# Patient Record
Sex: Female | Born: 1970 | Hispanic: Yes | Marital: Single | Smoking: Never smoker
Health system: Southern US, Community
[De-identification: ages and names within clinical notes are randomized; demographics above are authoritative.]

## PROBLEM LIST (undated history)

## (undated) DIAGNOSIS — H11009 Unspecified pterygium of unspecified eye: Secondary | ICD-10-CM

## (undated) DIAGNOSIS — M722 Plantar fascial fibromatosis: Secondary | ICD-10-CM

## (undated) DIAGNOSIS — E119 Type 2 diabetes mellitus without complications: Secondary | ICD-10-CM

## (undated) DIAGNOSIS — L209 Atopic dermatitis, unspecified: Secondary | ICD-10-CM

## (undated) DIAGNOSIS — E785 Hyperlipidemia, unspecified: Secondary | ICD-10-CM

## (undated) DIAGNOSIS — E039 Hypothyroidism, unspecified: Secondary | ICD-10-CM

---

## 2004-03-16 ENCOUNTER — Emergency Department (HOSPITAL_COMMUNITY): Admission: EM | Admit: 2004-03-16 | Discharge: 2004-03-16 | Payer: Self-pay | Admitting: Emergency Medicine

## 2005-10-05 ENCOUNTER — Emergency Department (HOSPITAL_COMMUNITY): Admission: EM | Admit: 2005-10-05 | Discharge: 2005-10-05 | Payer: Self-pay | Admitting: Emergency Medicine

## 2005-10-12 ENCOUNTER — Emergency Department (HOSPITAL_COMMUNITY): Admission: EM | Admit: 2005-10-12 | Discharge: 2005-10-12 | Payer: Self-pay | Admitting: Emergency Medicine

## 2005-10-20 ENCOUNTER — Emergency Department (HOSPITAL_COMMUNITY): Admission: EM | Admit: 2005-10-20 | Discharge: 2005-10-20 | Payer: Self-pay | Admitting: *Deleted

## 2005-11-01 ENCOUNTER — Emergency Department (HOSPITAL_COMMUNITY): Admission: EM | Admit: 2005-11-01 | Discharge: 2005-11-01 | Payer: Self-pay | Admitting: Emergency Medicine

## 2005-11-03 ENCOUNTER — Other Ambulatory Visit: Admission: RE | Admit: 2005-11-03 | Discharge: 2005-11-03 | Payer: Self-pay | Admitting: Obstetrics and Gynecology

## 2005-11-05 ENCOUNTER — Emergency Department (HOSPITAL_COMMUNITY): Admission: EM | Admit: 2005-11-05 | Discharge: 2005-11-05 | Payer: Self-pay | Admitting: *Deleted

## 2005-11-15 ENCOUNTER — Emergency Department (HOSPITAL_COMMUNITY): Admission: EM | Admit: 2005-11-15 | Discharge: 2005-11-15 | Payer: Self-pay | Admitting: Emergency Medicine

## 2006-12-18 ENCOUNTER — Ambulatory Visit: Payer: Self-pay | Admitting: Family Medicine

## 2006-12-19 ENCOUNTER — Ambulatory Visit: Payer: Self-pay | Admitting: Family Medicine

## 2006-12-20 ENCOUNTER — Ambulatory Visit: Payer: Self-pay | Admitting: *Deleted

## 2007-01-22 ENCOUNTER — Ambulatory Visit: Payer: Self-pay | Admitting: Family Medicine

## 2007-03-20 ENCOUNTER — Other Ambulatory Visit: Admission: RE | Admit: 2007-03-20 | Discharge: 2007-03-20 | Payer: Self-pay | Admitting: Nurse Practitioner

## 2007-03-20 ENCOUNTER — Encounter (INDEPENDENT_AMBULATORY_CARE_PROVIDER_SITE_OTHER): Payer: Self-pay | Admitting: Nurse Practitioner

## 2007-03-20 ENCOUNTER — Ambulatory Visit: Payer: Self-pay | Admitting: Internal Medicine

## 2007-04-25 ENCOUNTER — Ambulatory Visit: Payer: Self-pay | Admitting: Internal Medicine

## 2010-01-27 ENCOUNTER — Encounter: Admission: RE | Admit: 2010-01-27 | Discharge: 2010-01-27 | Payer: Self-pay | Admitting: Specialist

## 2011-08-23 ENCOUNTER — Other Ambulatory Visit: Payer: Self-pay | Admitting: Specialist

## 2011-08-23 DIAGNOSIS — R110 Nausea: Secondary | ICD-10-CM

## 2011-08-25 ENCOUNTER — Other Ambulatory Visit: Payer: Self-pay

## 2016-03-23 ENCOUNTER — Encounter (HOSPITAL_COMMUNITY): Payer: Self-pay | Admitting: *Deleted

## 2016-03-23 ENCOUNTER — Emergency Department (HOSPITAL_COMMUNITY)
Admission: EM | Admit: 2016-03-23 | Discharge: 2016-03-23 | Disposition: A | Discharge status: Home / Self Care | Payer: Self-pay | Attending: Emergency Medicine | Admitting: Emergency Medicine

## 2016-03-23 ENCOUNTER — Emergency Department (HOSPITAL_COMMUNITY): Payer: Self-pay

## 2016-03-23 DIAGNOSIS — K2970 Gastritis, unspecified, without bleeding: Secondary | ICD-10-CM | POA: Insufficient documentation

## 2016-03-23 DIAGNOSIS — E785 Hyperlipidemia, unspecified: Secondary | ICD-10-CM | POA: Insufficient documentation

## 2016-03-23 DIAGNOSIS — R1013 Epigastric pain: Secondary | ICD-10-CM

## 2016-03-23 DIAGNOSIS — Z79899 Other long term (current) drug therapy: Secondary | ICD-10-CM | POA: Insufficient documentation

## 2016-03-23 DIAGNOSIS — E039 Hypothyroidism, unspecified: Secondary | ICD-10-CM | POA: Insufficient documentation

## 2016-03-23 HISTORY — DX: Unspecified pterygium of unspecified eye: H11.009

## 2016-03-23 HISTORY — DX: Atopic dermatitis, unspecified: L20.9

## 2016-03-23 HISTORY — DX: Plantar fascial fibromatosis: M72.2

## 2016-03-23 HISTORY — DX: Hypothyroidism, unspecified: E03.9

## 2016-03-23 HISTORY — DX: Hyperlipidemia, unspecified: E78.5

## 2016-03-23 LAB — CBC WITH DIFFERENTIAL/PLATELET
BASOS ABS: 0 10*3/uL (ref 0.0–0.1)
BASOS PCT: 0 %
Eosinophils Absolute: 0 10*3/uL (ref 0.0–0.7)
Eosinophils Relative: 0 %
HEMATOCRIT: 35.4 % — AB (ref 36.0–46.0)
Hemoglobin: 11.8 g/dL — ABNORMAL LOW (ref 12.0–15.0)
Lymphocytes Relative: 17 %
Lymphs Abs: 0.9 10*3/uL (ref 0.7–4.0)
MCH: 28.5 pg (ref 26.0–34.0)
MCHC: 33.3 g/dL (ref 30.0–36.0)
MCV: 85.5 fL (ref 78.0–100.0)
MONO ABS: 0.6 10*3/uL (ref 0.1–1.0)
Monocytes Relative: 12 %
NEUTROS ABS: 3.8 10*3/uL (ref 1.7–7.7)
Neutrophils Relative %: 71 %
PLATELETS: 266 10*3/uL (ref 150–400)
RBC: 4.14 MIL/uL (ref 3.87–5.11)
RDW: 14.8 % (ref 11.5–15.5)
WBC: 5.3 10*3/uL (ref 4.0–10.5)

## 2016-03-23 LAB — URINE MICROSCOPIC-ADD ON

## 2016-03-23 LAB — COMPREHENSIVE METABOLIC PANEL
ALBUMIN: 3.6 g/dL (ref 3.5–5.0)
ALT: 46 U/L (ref 14–54)
AST: 50 U/L — AB (ref 15–41)
Alkaline Phosphatase: 98 U/L (ref 38–126)
Anion gap: 8 (ref 5–15)
BUN: 12 mg/dL (ref 6–20)
CHLORIDE: 102 mmol/L (ref 101–111)
CO2: 27 mmol/L (ref 22–32)
CREATININE: 0.62 mg/dL (ref 0.44–1.00)
Calcium: 8.6 mg/dL — ABNORMAL LOW (ref 8.9–10.3)
GFR calc non Af Amer: >60 mL/min (ref >60)
Glucose, Bld: 111 mg/dL — ABNORMAL HIGH (ref 65–99)
POTASSIUM: 3.5 mmol/L (ref 3.5–5.1)
Sodium: 137 mmol/L (ref 135–145)
Total Bilirubin: 0.5 mg/dL (ref 0.3–1.2)
Total Protein: 6.7 g/dL (ref 6.5–8.1)

## 2016-03-23 LAB — URINALYSIS, ROUTINE W REFLEX MICROSCOPIC
Bilirubin Urine: NEGATIVE
Glucose, UA: NEGATIVE mg/dL
Ketones, ur: NEGATIVE mg/dL
LEUKOCYTES UA: NEGATIVE
Nitrite: NEGATIVE
Protein, ur: NEGATIVE mg/dL
SPECIFIC GRAVITY, URINE: 1.007 (ref 1.005–1.030)
pH: 7.5 (ref 5.0–8.0)

## 2016-03-23 LAB — LIPASE, BLOOD: Lipase: 29 U/L (ref 11–51)

## 2016-03-23 MED ORDER — SUCRALFATE 1 G PO TABS: 1.0000 g | ORAL_TABLET | Freq: Three times a day (TID) | ORAL | Status: DC

## 2016-03-23 MED ORDER — HYDROMORPHONE HCL 1 MG/ML IJ SOLN
1.0000 mg | Freq: Once | INTRAMUSCULAR | Status: AC
  Administered 2016-03-23: 1 mg via INTRAVENOUS
  Filled 2016-03-23: qty 1

## 2016-03-23 MED ORDER — RANITIDINE HCL 150 MG PO CAPS: 150.0000 mg | ORAL_CAPSULE | Freq: Every day | ORAL | Status: DC

## 2016-03-23 MED ORDER — SODIUM CHLORIDE 0.9 % IV BOLUS (SEPSIS)
1000.0000 mL | Freq: Once | INTRAVENOUS | Status: AC
  Administered 2016-03-23: 1000 mL via INTRAVENOUS

## 2016-03-23 MED ORDER — ONDANSETRON HCL 4 MG/2ML IJ SOLN
4.0000 mg | Freq: Once | INTRAMUSCULAR | Status: AC
  Administered 2016-03-23: 4 mg via INTRAVENOUS
  Filled 2016-03-23: qty 2

## 2016-03-23 MED ORDER — GI COCKTAIL ~~LOC~~
30.0000 mL | Freq: Once | ORAL | Status: AC
  Administered 2016-03-23: 30 mL via ORAL
  Filled 2016-03-23: qty 30

## 2016-03-23 NOTE — ED Notes (Addendum)
RUQ abdominal sign since yesterday.  [facility] and [facility] for r/o cholecystisis vs pancreatitis.  Pt had 4mg  zofran from ems and a 20g IV was placed.  Spanish speaking

## 2016-03-23 NOTE — Discharge Instructions (Signed)
Gastritis - Adultos °(Gastritis, Adult) ° La gastrittis es la irritación (inflamación) de la membrana interna del estómago. Puede ser una enfermedad de inicio súbito (aguda) o de largo plazo (crónica). Si la gastritis no se trata, puede causar sangrado y úlceras. °CAUSAS  °La gastritis se produce cuando la membrana que tapiza interiormente al estómago se debilita o se daña. Los jugos digestivos del estómago inflaman el revestimiento del estómago debilitado. El revestimiento del estómago puede debilitarse o dañarse por una infección viral o bacteriana. La infección bacteriana más común es la infección por Helicobacter pylori. También puede ser el resultado del consumo excesivo de alcohol, por el uso de ciertos medicamentos o porque hay demasiado ácido en el estómago.  °SÍNTOMAS  °En algunos casos no hay síntomas. Si se presentan síntomas, éstos pueden ser:  °· Dolor o sensación de ardor en la parte superior del abdomen. °· Náuseas. °· Vómitos. °· Sensación molesta de distensión después de comer. °DIAGNÓSTICO  °El médico puede diagnosticar gastritis según los síntomas y el examen físico. Para determinar la causa de la gastritis, el médico podrá:  °· Pedir análisis de sangre o de materia fecal para diagnosticar la presencia de la bacteria H pylori. °· Gastroscopía. Un tubo delgado y flexible (endoscopio) se pasa por el esófago hasta llegar al estómago. El endoscopio tiene una luz y una cámara en el extremo. El médico utilizará el endoscopio para observar el interior del estómago. °· Tomará una muestra de tejido (biopsia) del estómago para examinarlo en el microscopio. °TRATAMIENTO  °Según la causa de la gastritis podrán recetarle: Antibióticos, si la causa es una infección bacteriana, como una infección por H. pylori. Antiácidos o bloqueadores H2, si hay demasiado ácido en el estómago. El médico le aconsejará que deje de tomar aspirina, ibuprofeno u otros antiinflamatorios no esteroides (AINE).  °INSTRUCCIONES PARA EL  CUIDADO EN EL HOGAR  °· Tome sólo medicamentos de venta libre o recetados, según las indicaciones del médico. °· Si le han recetado antibióticos, tómelos según las indicaciones. Tómelos todos, aunque se sienta mejor. °· Debe ingerir gran cantidad de líquido para mantener la orina de tono claro o color amarillo pálido. °· Evite las comidas y bebidas que empeoran los problemas, como: °¨ Bebidas con cafeína o alcohólicas. °¨ Chocolate. °¨ Sabores a menta. °¨ Ajo y cebolla. °¨ Comidas muy condimentadas. °¨ Cítricos como naranjas, limones o limas. °¨ Alimentos que contengan tomate, como salsas, chile y pizza. °¨ Alimentos fritos y grasos. °· Haga comidas pequeñas durante el día en lugar de 3 comidas abundantes. °SOLICITE ATENCIÓN MÉDICA DE INMEDIATO SI:  °· La materia fecal es negra o de color rojo oscuro. °· Vomita sangre de color rojo brillante o material similar a granos de café. °· No puede retener los líquidos. °· El dolor abdominal empeora. °· Tiene fiebre. °· No mejora luego de 1 semana. °· Tiene preguntas o preocupaciones. °ASEGÚRESE DE QUE:  °· Comprende estas instrucciones. °· Controlará su enfermedad. °· Solicitará ayuda de inmediato si no mejora o si empeora. °  °Esta información no tiene como fin reemplazar el consejo del médico. Asegúrese de hacerle al médico cualquier pregunta que tenga. °  °Document Released: 06/01/2005 Document Revised: 05/13/2015 °Elsevier Interactive Patient Education ©2016 [employer]. ° °

## 2016-03-23 NOTE — ED Provider Notes (Signed)
CSN: [id number]     Arrival date & time 03/23/16  1006 History   First MD Initiated Contact with Patient 03/23/16 1010     Chief Complaint  Patient presents with  . Abdominal Pain     (Consider location/radiation/quality/duration/timing/severity/associated sxs/prior Treatment) HPI Comments: 45 year old female with history of hypothyroidism presents for epigastric pain. The patient states that the pain started yesterday. She reports that the night before that she had felt some chills. She says that last night she developed severe pain in her upper abdomen and that it does radiate to her back. She reports that she has had some similar pain but it was much less severe after eating spicy food or chilies. She reports severe nausea without any vomiting. No diarrhea or constipation. She previously [place] but has not had any other surgical procedures. She was seen at an urgent care and sent in for evaluation for possible cholecystitis. Denies any history of drinking alcohol or smoking cigarettes.  Patient is a 45 y.o. female presenting with abdominal pain.  Abdominal Pain Associated symptoms: chills and nausea   Associated symptoms: no chest pain, no constipation, no cough, no diarrhea, no dysuria, no fatigue, no fever, no hematuria, no shortness of breath and no vomiting     Past Medical History  Diagnosis Date  . Atopic dermatitis   . Pterygium   . Plantar fasciitis   . Dyslipidemia   . Hypothyroid    History reviewed. No pertinent past surgical history. No family history on file. Social History  Substance Use Topics  . Smoking status: Never Smoker   . Smokeless tobacco: None  . Alcohol Use: No   OB History    No data available     Review of Systems  Constitutional: Positive for chills. Negative for fever and fatigue.  HENT: Negative for congestion, postnasal drip and rhinorrhea.   Eyes: Negative for visual disturbance.  Respiratory: Negative for cough, chest  tightness and shortness of breath.   Cardiovascular: Negative for chest pain and palpitations.  Gastrointestinal: Positive for nausea and abdominal pain. Negative for vomiting, diarrhea and constipation.  Genitourinary: Negative for dysuria, urgency and hematuria.  Musculoskeletal: Positive for back pain. Negative for myalgias.  Skin: Negative for rash.  Neurological: Negative for dizziness, weakness and headaches.  Hematological: Does not bruise/bleed easily.      Allergies  Review of patient's allergies indicates no known allergies.  Home Medications   Prior to Admission medications   Medication Sig Start Date End Date Taking? Authorizing Provider  levothyroxine (SYNTHROID, LEVOTHROID) 50 MCG tablet Take 50 mcg by mouth daily before breakfast.   Yes Historical Provider, MD  loratadine (CLARITIN) 10 MG tablet Take 10 mg by mouth daily.   Yes Historical Provider, MD  Omega-3 Fatty Acids (FISH OIL) 1000 MG CPDR Take by mouth.   Yes Historical Provider, MD  triamcinolone cream (KENALOG) 0.5 % Apply 1 application topically 2 (two) times daily.   Yes Historical Provider, MD   BP 118/65 mmHg  Pulse 65  Temp(Src) 97.9 F (36.6 C) (Oral)  Resp 15  SpO2 96%  LMP 03/20/2016 Physical Exam  Constitutional: She is oriented to person, place, and time. She appears well-developed and well-nourished.  Appears extremely uncomfortable, is tearful  HENT:  Head: Normocephalic and atraumatic.  Right Ear: External ear normal.  Left Ear: External ear normal.  Nose: Nose normal.  Mouth/Throat: Oropharynx is clear and moist. No oropharyngeal exudate.  Eyes: EOM are normal. Pupils are  equal, round, and reactive to light.  Neck: Normal range of motion. Neck supple.  Cardiovascular: Normal rate, regular rhythm, normal heart sounds and intact distal pulses.   No murmur heard. Pulmonary/Chest: Effort normal. No respiratory distress. She has no wheezes. She has no rales.  Abdominal: Soft. She exhibits  no distension. There is tenderness (moderate to severe) in the right upper quadrant and epigastric area.  Musculoskeletal: Normal range of motion. She exhibits no edema or tenderness.  Neurological: She is alert and oriented to person, place, and time.  Skin: Skin is warm and dry. No rash noted. She is not diaphoretic.  Vitals reviewed.   ED Course  Procedures (including critical care time) Labs Review Labs Reviewed  CBC WITH DIFFERENTIAL/PLATELET - Abnormal; Notable for the following:    Hemoglobin 11.8 (*)    HCT 35.4 (*)    All other components within normal limits  COMPREHENSIVE METABOLIC PANEL - Abnormal; Notable for the following:    Glucose, Bld 111 (*)    Calcium 8.6 (*)    AST 50 (*)    All other components within normal limits  LIPASE, BLOOD  URINALYSIS, ROUTINE W REFLEX MICROSCOPIC (NOT AT [facility])    Imaging Review US Abdomen Limited Ruq  03/23/2016  CLINICAL DATA:  Right upper quadrant pain for 2 days EXAM: US ABDOMEN LIMITED - RIGHT UPPER QUADRANT COMPARISON:  None. FINDINGS: Gallbladder: No gallstones or wall thickening visualized. No sonographic Murphy sign noted by sonographer. Common bile duct: Diameter: 3.3 mm in diameter within normal limits Liver: No focal hepatic mass. There is mild increased echogenicity of the liver suspicious for fatty infiltration. IMPRESSION: 1. No gallstones are noted within gallbladder. Normal CBD. No focal hepatic mass. Mild increased echogenicity of the liver suspicious for fatty infiltration. Electronically Signed   By: Natasha Mead M.D.   On: 03/23/2016 11:15   I have personally reviewed and evaluated these images and lab results as part of my medical decision-making.   EKG Interpretation None      MDM  Patient was seen and evaluated at bedside.  Patient appears uncomfortable.  Concern for cholecystitis vs gastritis.  Pain and nausea medication ordered.  Will check CBC, CMP, lipase, UA, RUQ [place].  Labs unremarkable and [place] without acute  process.  GI cocktail ordered.  12:44 PM Patient reports feeling improved. Less pain.  Patient tolerated PO without difficulty.  She was discharged home in stable condition with instruction to follow up outpatient and with prescription for carafate and eduction on gastritis and peptic ulcer disease.  All resutls and plan of care were reviewed via interpreter over the phone.  Patient expressed understanding and agreement and was given the opportunity to ask any questions.  She was discharged home in stable condition. Final diagnoses:  Epigastric pain    1. Gastritis     Leta Baptist, MD 03/24/16 [phone]

## 2016-03-23 NOTE — ED Notes (Signed)
Bed: WA05 Expected date:  Expected time:  Means of arrival:  Comments: EMS-abdominal pain 

## 2016-03-23 NOTE — ED Notes (Signed)
Pt is having US at this time

## 2016-09-26 ENCOUNTER — Emergency Department (HOSPITAL_COMMUNITY)
Admission: EM | Admit: 2016-09-26 | Discharge: 2016-09-27 | Disposition: A | Discharge status: Home / Self Care | Payer: Self-pay | Attending: Emergency Medicine | Admitting: Emergency Medicine

## 2016-09-26 ENCOUNTER — Encounter (HOSPITAL_COMMUNITY): Payer: Self-pay

## 2016-09-26 DIAGNOSIS — R059 Cough, unspecified: Secondary | ICD-10-CM

## 2016-09-26 DIAGNOSIS — J181 Lobar pneumonia, unspecified organism: Secondary | ICD-10-CM | POA: Insufficient documentation

## 2016-09-26 DIAGNOSIS — R05 Cough: Secondary | ICD-10-CM

## 2016-09-26 DIAGNOSIS — Z79899 Other long term (current) drug therapy: Secondary | ICD-10-CM | POA: Insufficient documentation

## 2016-09-26 DIAGNOSIS — E119 Type 2 diabetes mellitus without complications: Secondary | ICD-10-CM | POA: Insufficient documentation

## 2016-09-26 DIAGNOSIS — J189 Pneumonia, unspecified organism: Secondary | ICD-10-CM

## 2016-09-26 DIAGNOSIS — R791 Abnormal coagulation profile: Secondary | ICD-10-CM | POA: Insufficient documentation

## 2016-09-26 DIAGNOSIS — R0981 Nasal congestion: Secondary | ICD-10-CM

## 2016-09-26 DIAGNOSIS — E039 Hypothyroidism, unspecified: Secondary | ICD-10-CM | POA: Insufficient documentation

## 2016-09-26 DIAGNOSIS — R509 Fever, unspecified: Secondary | ICD-10-CM

## 2016-09-26 HISTORY — DX: Type 2 diabetes mellitus without complications: E11.9

## 2016-09-26 MED ORDER — ACETAMINOPHEN 325 MG PO TABS
ORAL_TABLET | ORAL | Status: DC
Start: 2016-09-26 — End: 2016-09-27
  Filled 2016-09-26: qty 1

## 2016-09-26 MED ORDER — ACETAMINOPHEN 325 MG PO TABS
650.0000 mg | ORAL_TABLET | Freq: Once | ORAL | Status: AC | PRN
  Administered 2016-09-26: 650 mg via ORAL

## 2016-09-26 MED ORDER — ACETAMINOPHEN 325 MG PO TABS
ORAL_TABLET | ORAL | Status: AC
  Administered 2016-09-26: 650 mg via ORAL
  Filled 2016-09-26: qty 2

## 2016-09-26 NOTE — ED Triage Notes (Signed)
Pt states she has had flu sx x 3 weeks with no relief; pt has been taking OTC med at home; pt presents tachycardic with labored breathing at triage; Pt states body aches at 8/10 on arrival. Pt a&ox4; Pt speaks spanish

## 2016-09-27 ENCOUNTER — Emergency Department (HOSPITAL_COMMUNITY): Payer: Self-pay

## 2016-09-27 LAB — CBC WITH DIFFERENTIAL/PLATELET
BASOS PCT: 0 %
Basophils Absolute: 0 10*3/uL (ref 0.0–0.1)
Eosinophils Absolute: 0.5 10*3/uL (ref 0.0–0.7)
Eosinophils Relative: 6 %
HEMATOCRIT: 37.8 % (ref 36.0–46.0)
Hemoglobin: 12.8 g/dL (ref 12.0–15.0)
LYMPHS ABS: 1.1 10*3/uL (ref 0.7–4.0)
Lymphocytes Relative: 14 %
MCH: 29.2 pg (ref 26.0–34.0)
MCHC: 33.9 g/dL (ref 30.0–36.0)
MCV: 86.3 fL (ref 78.0–100.0)
MONO ABS: 0.9 10*3/uL (ref 0.1–1.0)
MONOS PCT: 11 %
Neutro Abs: 5.6 10*3/uL (ref 1.7–7.7)
Neutrophils Relative %: 70 %
Platelets: 324 10*3/uL (ref 150–400)
RBC: 4.38 MIL/uL (ref 3.87–5.11)
RDW: 14.7 % (ref 11.5–15.5)
WBC: 8.1 10*3/uL (ref 4.0–10.5)

## 2016-09-27 LAB — COMPREHENSIVE METABOLIC PANEL
ALBUMIN: 3.6 g/dL (ref 3.5–5.0)
ALT: 27 U/L (ref 14–54)
ANION GAP: 9 (ref 5–15)
AST: 35 U/L (ref 15–41)
Alkaline Phosphatase: 109 U/L (ref 38–126)
BUN: 7 mg/dL (ref 6–20)
CHLORIDE: 102 mmol/L (ref 101–111)
CO2: 25 mmol/L (ref 22–32)
Calcium: 9.1 mg/dL (ref 8.9–10.3)
Creatinine, Ser: 0.72 mg/dL (ref 0.44–1.00)
GFR calc Af Amer: >60 mL/min (ref >60)
GFR calc non Af Amer: >60 mL/min (ref >60)
GLUCOSE: 102 mg/dL — AB (ref 65–99)
POTASSIUM: 3.8 mmol/L (ref 3.5–5.1)
SODIUM: 136 mmol/L (ref 135–145)
TOTAL PROTEIN: 7 g/dL (ref 6.5–8.1)

## 2016-09-27 LAB — URINALYSIS, ROUTINE W REFLEX MICROSCOPIC
Bilirubin Urine: NEGATIVE
GLUCOSE, UA: NEGATIVE mg/dL
HGB URINE DIPSTICK: NEGATIVE
Ketones, ur: NEGATIVE mg/dL
LEUKOCYTES UA: NEGATIVE
Nitrite: NEGATIVE
PH: 9 — AB (ref 5.0–8.0)
PROTEIN: NEGATIVE mg/dL
Specific Gravity, Urine: 1.023 (ref 1.005–1.030)

## 2016-09-27 LAB — PROTIME-INR
INR: 0.87
Prothrombin Time: 11.9 seconds (ref 11.4–15.2)

## 2016-09-27 LAB — I-STAT CG4 LACTIC ACID, ED: Lactic Acid, Venous: 1.35 mmol/L (ref 0.5–1.9)

## 2016-09-27 LAB — POC URINE PREG, ED: Preg Test, Ur: NEGATIVE

## 2016-09-27 MED ORDER — SODIUM CHLORIDE 0.9 % IV BOLUS (SEPSIS)
1000.0000 mL | Freq: Once | INTRAVENOUS | Status: AC
  Administered 2016-09-27: 1000 mL via INTRAVENOUS

## 2016-09-27 MED ORDER — ALBUTEROL SULFATE HFA 108 (90 BASE) MCG/ACT IN AERS
2.0000 | INHALATION_SPRAY | RESPIRATORY_TRACT | Status: DC | PRN
  Administered 2016-09-27: 2 via RESPIRATORY_TRACT
  Filled 2016-09-27: qty 6.7

## 2016-09-27 MED ORDER — SODIUM CHLORIDE 0.9 % IV BOLUS (SEPSIS)
500.0000 mL | Freq: Once | INTRAVENOUS | Status: AC
  Administered 2016-09-27: 500 mL via INTRAVENOUS

## 2016-09-27 MED ORDER — FLUTICASONE PROPIONATE 50 MCG/ACT NA SUSP: 2.0000 | Freq: Every day | NASAL | 2 refills | Status: AC

## 2016-09-27 MED ORDER — IBUPROFEN 400 MG PO TABS
400.0000 mg | ORAL_TABLET | Freq: Once | ORAL | Status: AC
  Administered 2016-09-27: 400 mg via ORAL
  Filled 2016-09-27: qty 1

## 2016-09-27 MED ORDER — LEVOFLOXACIN 750 MG PO TABS
750.0000 mg | ORAL_TABLET | Freq: Once | ORAL | Status: AC
  Administered 2016-09-27: 750 mg via ORAL
  Filled 2016-09-27: qty 1

## 2016-09-27 MED ORDER — AEROCHAMBER PLUS W/MASK MISC
1.0000 | Freq: Once | Status: AC
  Administered 2016-09-27: 1
  Filled 2016-09-27: qty 1

## 2016-09-27 MED ORDER — LEVOFLOXACIN 750 MG PO TABS: 750.0000 mg | ORAL_TABLET | Freq: Every day | ORAL | 0 refills | Status: AC

## 2016-09-27 MED ORDER — ALBUTEROL SULFATE (2.5 MG/3ML) 0.083% IN NEBU
5.0000 mg | INHALATION_SOLUTION | Freq: Once | RESPIRATORY_TRACT | Status: AC
  Administered 2016-09-27: 5 mg via RESPIRATORY_TRACT
  Filled 2016-09-27: qty 6

## 2016-09-27 MED ORDER — BENZONATATE 100 MG PO CAPS: 100.0000 mg | ORAL_CAPSULE | Freq: Three times a day (TID) | ORAL | 0 refills | Status: AC

## 2016-09-27 NOTE — Discharge Instructions (Signed)
1. Medications: flonase, OTC mucinex, tessalon, albuterol, Levaquin, usual home medications 2. Treatment: rest, drink plenty of fluids, take tylenol or ibuprofen for fever control 3. Follow Up: Please followup with your primary doctor in 2-3 days for discussion of your diagnoses and further evaluation after today's visit; if you do not have a primary care doctor use the resource guide provided to find one; Return to the ER for high fevers, difficulty breathing or other concerning symptoms

## 2016-09-27 NOTE — ED Provider Notes (Signed)
[facility]-EMERGENCY DEPT Provider Note   CSN: [id number] Arrival date & time: 09/26/16  2324     History   Chief Complaint Chief Complaint  Patient presents with  . Chills  . Generalized Body Aches  . Cough  . Fever    HPI Melissa Frye is a 46 y.o. female with a hx of Dermatitis, non-insulin-dependent diabetes, hypothyroidism presents to the Emergency Department complaining of waxing and waning influenza-like illness onset 3 weeks ago. Associated symptoms include cough, rhinorrhea, subjective fevers and myalgias rated at a 8/10.  Pt reports taking anti-pyretics at home without relief.  Nothing makes it better and nothing makes it worse.  Pt denies headache, neck pain, chest pain, SOB, rash, open wounds, abd pain, vomiting, diarrhea, dizziness syncope.  Denies recent travel or leg swelling.  Did not receive a flu vaccine this year.  Sick contacts at work with similar symptoms.     The history is provided by the patient and medical records. The history is limited by a language barrier. A language interpreter was used.    Past Medical History:  Diagnosis Date  . Atopic dermatitis   . Diabetes mellitus without complication (HCC)   . Dyslipidemia   . Hypothyroid   . Plantar fasciitis   . Pterygium     There are no active problems to display for this patient.   History reviewed. No pertinent surgical history.  OB History    No data available       Home Medications    Prior to Admission medications   Medication Sig Start Date End Date Taking? Authorizing Provider  levothyroxine (SYNTHROID, LEVOTHROID) 50 MCG tablet Take 50 mcg by mouth daily before breakfast.   Yes Historical Provider, MD  loratadine (CLARITIN) 10 MG tablet Take 10 mg by mouth daily as needed for allergies.    Yes Historical Provider, MD  benzonatate (TESSALON) 100 MG capsule Take 1 capsule (100 mg total) by mouth every 8 (eight) hours. 09/27/16   Shayne Deerman, PA-C  fluticasone (FLONASE) 50  MCG/ACT nasal spray Place 2 sprays into both nostrils daily. 09/27/16   Cederic Mozley, PA-C  levofloxacin (LEVAQUIN) 750 MG tablet Take 1 tablet (750 mg total) by mouth daily. X 7 days 09/27/16   Dierdre ForthHannah Tashira Torre, PA-C    Family History No family history on file.  Social History Social History  Substance Use Topics  . Smoking status: Never Smoker  . Smokeless tobacco: Not on file  . Alcohol use No     Allergies   Patient has no known allergies.   Review of Systems Review of Systems  Constitutional: Positive for fever.  HENT: Positive for congestion, sinus pressure and sore throat.   Eyes: Negative for visual disturbance.  Respiratory: Positive for cough and chest tightness.   Gastrointestinal: Positive for nausea.  Endocrine: Negative for polydipsia, polyphagia and polyuria.  Genitourinary: Positive for dysuria ( x3 mos).  Musculoskeletal: Positive for myalgias.  Skin: Negative for rash.  Neurological: Negative for headaches.  Hematological: Negative for adenopathy.     Physical Exam Updated Vital Signs BP 112/78 (BP Location: Right Arm)   Pulse 115   Temp 100.9 F (38.3 C) (Oral)   Resp (!) 28   Wt 89.8 kg   LMP 08/30/2016   SpO2 98%   Physical Exam  Constitutional: She appears well-developed and well-nourished. No distress.  Awake, alert, nontoxic appearance  HENT:  Head: Normocephalic and atraumatic.  Mouth/Throat: Oropharynx is clear and moist. No oropharyngeal exudate.  Eyes: Conjunctivae  are normal. No scleral icterus.  Neck: Normal range of motion. Neck supple.  Cardiovascular: Regular rhythm and intact distal pulses.  Tachycardia present.   Pulmonary/Chest: Breath sounds normal. Accessory muscle usage present. Tachypnea noted. She is in respiratory distress. She has no wheezes.  Equal chest expansion  Abdominal: Soft. Bowel sounds are normal. She exhibits no mass. There is no tenderness. There is no rebound and no guarding.  Musculoskeletal:  Normal range of motion. She exhibits no edema.  Neurological: She is alert.  Speech is clear and goal oriented Moves extremities without ataxia  Skin: Skin is warm and dry. She is not diaphoretic.  Psychiatric: She has a normal mood and affect.  Nursing note and vitals reviewed.    ED Treatments / Results  Labs (all labs ordered are listed, but only abnormal results are displayed) Labs Reviewed  COMPREHENSIVE METABOLIC PANEL - Abnormal; Notable for the following:       Result Value   Glucose, Bld 102 (*)    Total Bilirubin <0.1 (*)    All other components within normal limits  URINALYSIS, ROUTINE W REFLEX MICROSCOPIC - Abnormal; Notable for the following:    pH 9.0 (*)    All other components within normal limits  CULTURE, BLOOD (ROUTINE X 2)  CULTURE, BLOOD (ROUTINE X 2)  URINE CULTURE  CBC WITH DIFFERENTIAL/PLATELET  PROTIME-INR  I-STAT CG4 LACTIC ACID, ED  POC URINE PREG, ED     Radiology Dg Chest 2 View  Result Date: 09/27/2016 CLINICAL DATA:  Fever and chest pain with cough times 10 days EXAM: CHEST  2 VIEW COMPARISON:  None. FINDINGS: Calcified granuloma in the left upper lobe. Mild diffuse interstitial prominence consistent bronchitic change. No alveolar consolidation. Elevated right hemidiaphragm with mild extrapleural thickening along the periphery of the right hemithorax. Loculated small left effusion is not entirely excluded. Heart is top-normal in size. There is aortic atherosclerosis. No acute osseous abnormality IMPRESSION: 1. Mild diffuse interstitial prominence which may reflect bronchitic change, possibly chronic. No alveolar consolidation. 2. Left upper lobe calcified nodule versus bone island. 3. Elevation of the right hemidiaphragm with mild extrapleural thickening versus trace loculated fluid. Electronically Signed   By: Tollie Eth M.D.   On: 09/27/2016 00:26    Procedures Procedures (including critical care time)  Medications Ordered in ED Medications    levofloxacin (LEVAQUIN) tablet 750 mg (not administered)  albuterol (PROVENTIL HFA;VENTOLIN HFA) 108 (90 Base) MCG/ACT inhaler 2 puff (not administered)  aerochamber plus with mask device 1 each (not administered)  acetaminophen (TYLENOL) tablet 650 mg (650 mg Oral Given 09/26/16 2346)  albuterol (PROVENTIL) (2.5 MG/3ML) 0.083% nebulizer solution 5 mg (5 mg Nebulization Given 09/27/16 0205)  sodium chloride 0.9 % bolus 1,000 mL (0 mLs Intravenous Stopped 09/27/16 0256)  ibuprofen (ADVIL,MOTRIN) tablet 400 mg (400 mg Oral Given 09/27/16 0204)  sodium chloride 0.9 % bolus 500 mL (500 mLs Intravenous New Bag/Given 09/27/16 0416)     Initial Impression / Assessment and Plan / ED Course  I have reviewed the triage vital signs and the nursing notes.  Pertinent labs & imaging results that were available during my care of the patient were reviewed by me and considered in my medical decision making (see chart for details).     Patient presents with fever, tachypnea and 3 weeks of URI symptoms. Chest x-ray with questionable pneumonia. Will begin Levaquin. Normal lactic acid. Normal white blood cell count.  No anemia. No evidence of UTI.  Patient fever controlled with  improvement of vital signs.  No evidence of sepsis.  Patient reports she is feeling significantly better. Discussed reasons to return to the emergency department including worsening fevers, double deep breathing or other concerns. Patient referred to the [facility] to set up primary care. She states understanding and is in agreement with the plan.    Final Clinical Impressions(s) / ED Diagnoses   Final diagnoses:  Community acquired pneumonia of right lower lobe of lung (HCC)  Congestion of nasal sinus  Fever, unspecified fever cause  Cough    New Prescriptions New Prescriptions   BENZONATATE (TESSALON) 100 MG CAPSULE    Take 1 capsule (100 mg total) by mouth every 8 (eight) hours.   FLUTICASONE (FLONASE) 50 MCG/ACT NASAL SPRAY     Place 2 sprays into both nostrils daily.   LEVOFLOXACIN (LEVAQUIN) 750 MG TABLET    Take 1 tablet (750 mg total) by mouth daily. X 7 days     Dierdre Forth, PA-C 09/27/16 1610    Shon Baton, MD 09/28/16 [phone]

## 2016-09-28 LAB — URINE CULTURE

## 2016-10-02 ENCOUNTER — Encounter (HOSPITAL_COMMUNITY): Payer: Self-pay | Admitting: *Deleted

## 2016-10-02 ENCOUNTER — Emergency Department (HOSPITAL_COMMUNITY): Payer: Self-pay

## 2016-10-02 ENCOUNTER — Emergency Department (HOSPITAL_COMMUNITY)
Admission: EM | Admit: 2016-10-02 | Discharge: 2016-10-02 | Disposition: A | Discharge status: Home / Self Care | Payer: Self-pay | Attending: Emergency Medicine | Admitting: Emergency Medicine

## 2016-10-02 DIAGNOSIS — E039 Hypothyroidism, unspecified: Secondary | ICD-10-CM | POA: Insufficient documentation

## 2016-10-02 DIAGNOSIS — J111 Influenza due to unidentified influenza virus with other respiratory manifestations: Secondary | ICD-10-CM | POA: Insufficient documentation

## 2016-10-02 DIAGNOSIS — E119 Type 2 diabetes mellitus without complications: Secondary | ICD-10-CM | POA: Insufficient documentation

## 2016-10-02 DIAGNOSIS — Z79899 Other long term (current) drug therapy: Secondary | ICD-10-CM | POA: Insufficient documentation

## 2016-10-02 LAB — CULTURE, BLOOD (ROUTINE X 2)
CULTURE: NO GROWTH
CULTURE: NO GROWTH

## 2016-10-02 MED ORDER — ONDANSETRON 4 MG PO TBDP: 4.0000 mg | ORAL_TABLET | Freq: Three times a day (TID) | ORAL | 0 refills | Status: AC | PRN

## 2016-10-02 MED ORDER — IBUPROFEN 800 MG PO TABS
800.0000 mg | ORAL_TABLET | Freq: Once | ORAL | Status: AC
  Administered 2016-10-02: 800 mg via ORAL
  Filled 2016-10-02: qty 1

## 2016-10-02 MED ORDER — ACETAMINOPHEN 500 MG PO TABS
1000.0000 mg | ORAL_TABLET | Freq: Once | ORAL | Status: AC
Start: 2016-10-02 — End: 2016-10-02
  Administered 2016-10-02: 1000 mg via ORAL
  Filled 2016-10-02: qty 2

## 2016-10-02 NOTE — ED Notes (Signed)
Used spanish interpreter for discharge instructions

## 2016-10-02 NOTE — ED Provider Notes (Signed)
[facility]-EMERGENCY DEPT Provider Note   CSN: [id number] Arrival date & time: 10/02/16  1015     History   Chief Complaint Chief Complaint  Patient presents with  . Influenza    HPI Melissa Frye is a 46 y.o. female.  46 yo F with a chief complaint of cough congestion fevers chills myalgias and headache. Going on for the past week. Patient was seen here at the onset of symptoms diagnosed with the flu and possible pneumonia. She was started on antibiotics and Tessalon Perles. Patient has not taken any other medicines since this started. She feels that she's been having continued fevers and chills. She also has been having trouble eating and drinking. Having a worsening headache. Denies vomiting.   The history is provided by the patient and a relative. The history is limited by a language barrier. A language interpreter was used.  Illness  This is a new problem. The current episode started more than 2 days ago. The problem occurs constantly. The problem has not changed since onset.Associated symptoms include headaches. Pertinent negatives include no chest pain and no shortness of breath. Nothing aggravates the symptoms. Nothing relieves the symptoms. She has tried nothing for the symptoms. The treatment provided no relief.    Past Medical History:  Diagnosis Date  . Atopic dermatitis   . Diabetes mellitus without complication (HCC)   . Dyslipidemia   . Hypothyroid   . Plantar fasciitis   . Pterygium     There are no active problems to display for this patient.   History reviewed. No pertinent surgical history.  OB History    No data available       Home Medications    Prior to Admission medications   Medication Sig Start Date End Date Taking? Authorizing Provider  benzonatate (TESSALON) 100 MG capsule Take 1 capsule (100 mg total) by mouth every 8 (eight) hours. 09/27/16   Hannah Muthersbaugh, PA-C  fluticasone (FLONASE) 50 MCG/ACT nasal spray Place 2 sprays into  both nostrils daily. 09/27/16   Hannah Muthersbaugh, PA-C  levofloxacin (LEVAQUIN) 750 MG tablet Take 1 tablet (750 mg total) by mouth daily. X 7 days 09/27/16   Dahlia Client Muthersbaugh, PA-C  levothyroxine (SYNTHROID, LEVOTHROID) 50 MCG tablet Take 50 mcg by mouth daily before breakfast.    Historical Provider, MD  loratadine (CLARITIN) 10 MG tablet Take 10 mg by mouth daily as needed for allergies.     Historical Provider, MD  ondansetron (ZOFRAN ODT) 4 MG disintegrating tablet Take 1 tablet (4 mg total) by mouth every 8 (eight) hours as needed for nausea or vomiting. 10/02/16   Melene Plan, DO    Family History History reviewed. No pertinent family history.  Social History Social History  Substance Use Topics  . Smoking status: Never Smoker  . Smokeless tobacco: Not on file  . Alcohol use No     Allergies   Patient has no known allergies.   Review of Systems Review of Systems  Constitutional: Positive for chills and fever.  HENT: Positive for congestion. Negative for rhinorrhea.   Eyes: Negative for redness and visual disturbance.  Respiratory: Positive for cough. Negative for shortness of breath and wheezing.   Cardiovascular: Negative for chest pain and palpitations.  Gastrointestinal: Negative for nausea and vomiting.  Genitourinary: Negative for dysuria and urgency.  Musculoskeletal: Positive for myalgias. Negative for arthralgias.  Skin: Negative for pallor and wound.  Neurological: Positive for headaches. Negative for dizziness.     Physical Exam Updated Vital  Signs BP 117/89 (BP Location: Right Arm)   Pulse 78   Temp 98.4 F (36.9 C) (Oral)   Resp 15   SpO2 100%   Physical Exam  Constitutional: She is oriented to person, place, and time. She appears well-developed and well-nourished. No distress.  HENT:  Head: Normocephalic and atraumatic.  Eyes: EOM are normal. Pupils are equal, round, and reactive to light.  Neck: Normal range of motion. Neck supple.    Cardiovascular: Normal rate and regular rhythm.  Exam reveals no gallop and no friction rub.   No murmur heard. Pulmonary/Chest: Effort normal. She has no wheezes. She has no rales.  Transient rhonchi in the right lower field, tachypnea   Abdominal: Soft. She exhibits no distension and no mass. There is no tenderness. There is no guarding.  Musculoskeletal: She exhibits no edema or tenderness.  Neurological: She is alert and oriented to person, place, and time.  Skin: Skin is warm and dry. She is not diaphoretic.  Psychiatric: She has a normal mood and affect. Her behavior is normal.  Nursing note and vitals reviewed.    ED Treatments / Results  Labs (all labs ordered are listed, but only abnormal results are displayed) Labs Reviewed - No data to display  EKG  EKG Interpretation  Date/Time:  Sunday October 02 2016 11:14:05 EST Ventricular Rate:  80 PR Interval:    QRS Duration: 74 QT Interval:  364 QTC Calculation: 420 R Axis:   40 Text Interpretation:  Sinus rhythm No old tracing to compare Confirmed by Tatsuya Okray MD, DANIEL ([id number]) on 10/02/2016 11:21:26 AM       Radiology Dg Chest 2 View  Result Date: 10/02/2016 CLINICAL DATA:  Cough, fever, body aches EXAM: CHEST  2 VIEW COMPARISON:  09/27/2016 FINDINGS: Low lung volumes. Elevation of the right hemidiaphragm, stable. Bibasilar airspace opacities. Mild peribronchial thickening. Blunting of the right costophrenic angle again noted which could represent pleural thickening or small effusion, unchanged No acute bony abnormality. IMPRESSION: Low volumes. Bronchitic changes with bibasilar atelectasis or infiltrates. Small right pleural effusion versus pleural thickening, stable. Electronically Signed   By: Kevin  Dover M.D.   On: 10/02/2016 11:42    Procedures Procedures (including critical care time)  Medications Ordered in ED Medications  acetaminophen (TYLENOL) tablet 1,000 mg (1,000 mg Oral Given 10/02/16 1146)  ibuprofen  (ADVIL,MOTRIN) tablet 800 mg (800 mg Oral Given 10/02/16 1146)     Initial Impression / Assessment and Plan / ED Course  I have reviewed the triage vital signs and the nursing notes.  Pertinent labs & imaging results that were available during my care of the patient were reviewed by me and considered in my medical decision making (see chart for details).     45  yo F With a chief complaint of influenza. This is the expected course somewhat he was not taking Tylenol or ibuprofen in my opinion. With the recent diagnosis of pneumonia will obtain a chest x-ray to evaluate for possible worsening. We'll give Tylenol and ibuprofen.  11:51 AM:  I have discussed the diagnosis/risks/treatment options with the patient and family and believe the pt to be eligible for discharge home to follow-up with PCP. We also discussed returning to the ED immediately if new or worsening sx occur. We discussed the sx which are most concerning (e.g., sudden worsening pain, fever, inability to tolerate by mouth) that necessitate immediate return. Medications administered to the patient during their visit and any new prescriptions provided to the patient are listed  below.  Medications given during this visit Medications  acetaminophen (TYLENOL) tablet 1,000 mg (1,000 mg Oral Given 10/02/16 1146)  ibuprofen (ADVIL,MOTRIN) tablet 800 mg (800 mg Oral Given 10/02/16 1146)     The patient appears reasonably screen and/or stabilized for discharge and I doubt any other medical condition or other [facility] requiring further screening, evaluation, or treatment in the ED at this time prior to discharge.    Final Clinical Impressions(s) / ED Diagnoses   Final diagnoses:  Influenza    New Prescriptions New Prescriptions   ONDANSETRON (ZOFRAN ODT) 4 MG DISINTEGRATING TABLET    Take 1 tablet (4 mg total) by mouth every 8 (eight) hours as needed for nausea or vomiting.     Melene Planan Karsten Vaughn, DO 10/02/16 1151

## 2016-10-02 NOTE — ED Notes (Signed)
MD and RN  using interpreter line to communicate with pt

## 2016-10-02 NOTE — ED Triage Notes (Addendum)
Pt was seen here on 1/22 for flu like symptoms. reports feeling better and now has return of symptoms such as headache, bodyaches, hoarseness, fever and fatigue. Airway intact. Mask on pt at triage. Used interpretor phone, pt speaks spanish.

## 2016-10-02 NOTE — Discharge Instructions (Signed)
Take tylenol 2 pills 4 times a day and motrin 4 pills 3 times a day.  Drink plenty of fluids.  Return for worsening shortness of breath, headache, confusion. Follow up with your family doctor.   

## 2018-04-07 IMAGING — DX DG CHEST 2V
2 series · 2 of 2 positions shown · non-contrast
Comparison: None.

CLINICAL DATA: Fever and chest pain with cough times 10 days

EXAM:
CHEST  2 VIEW

[chest pa]
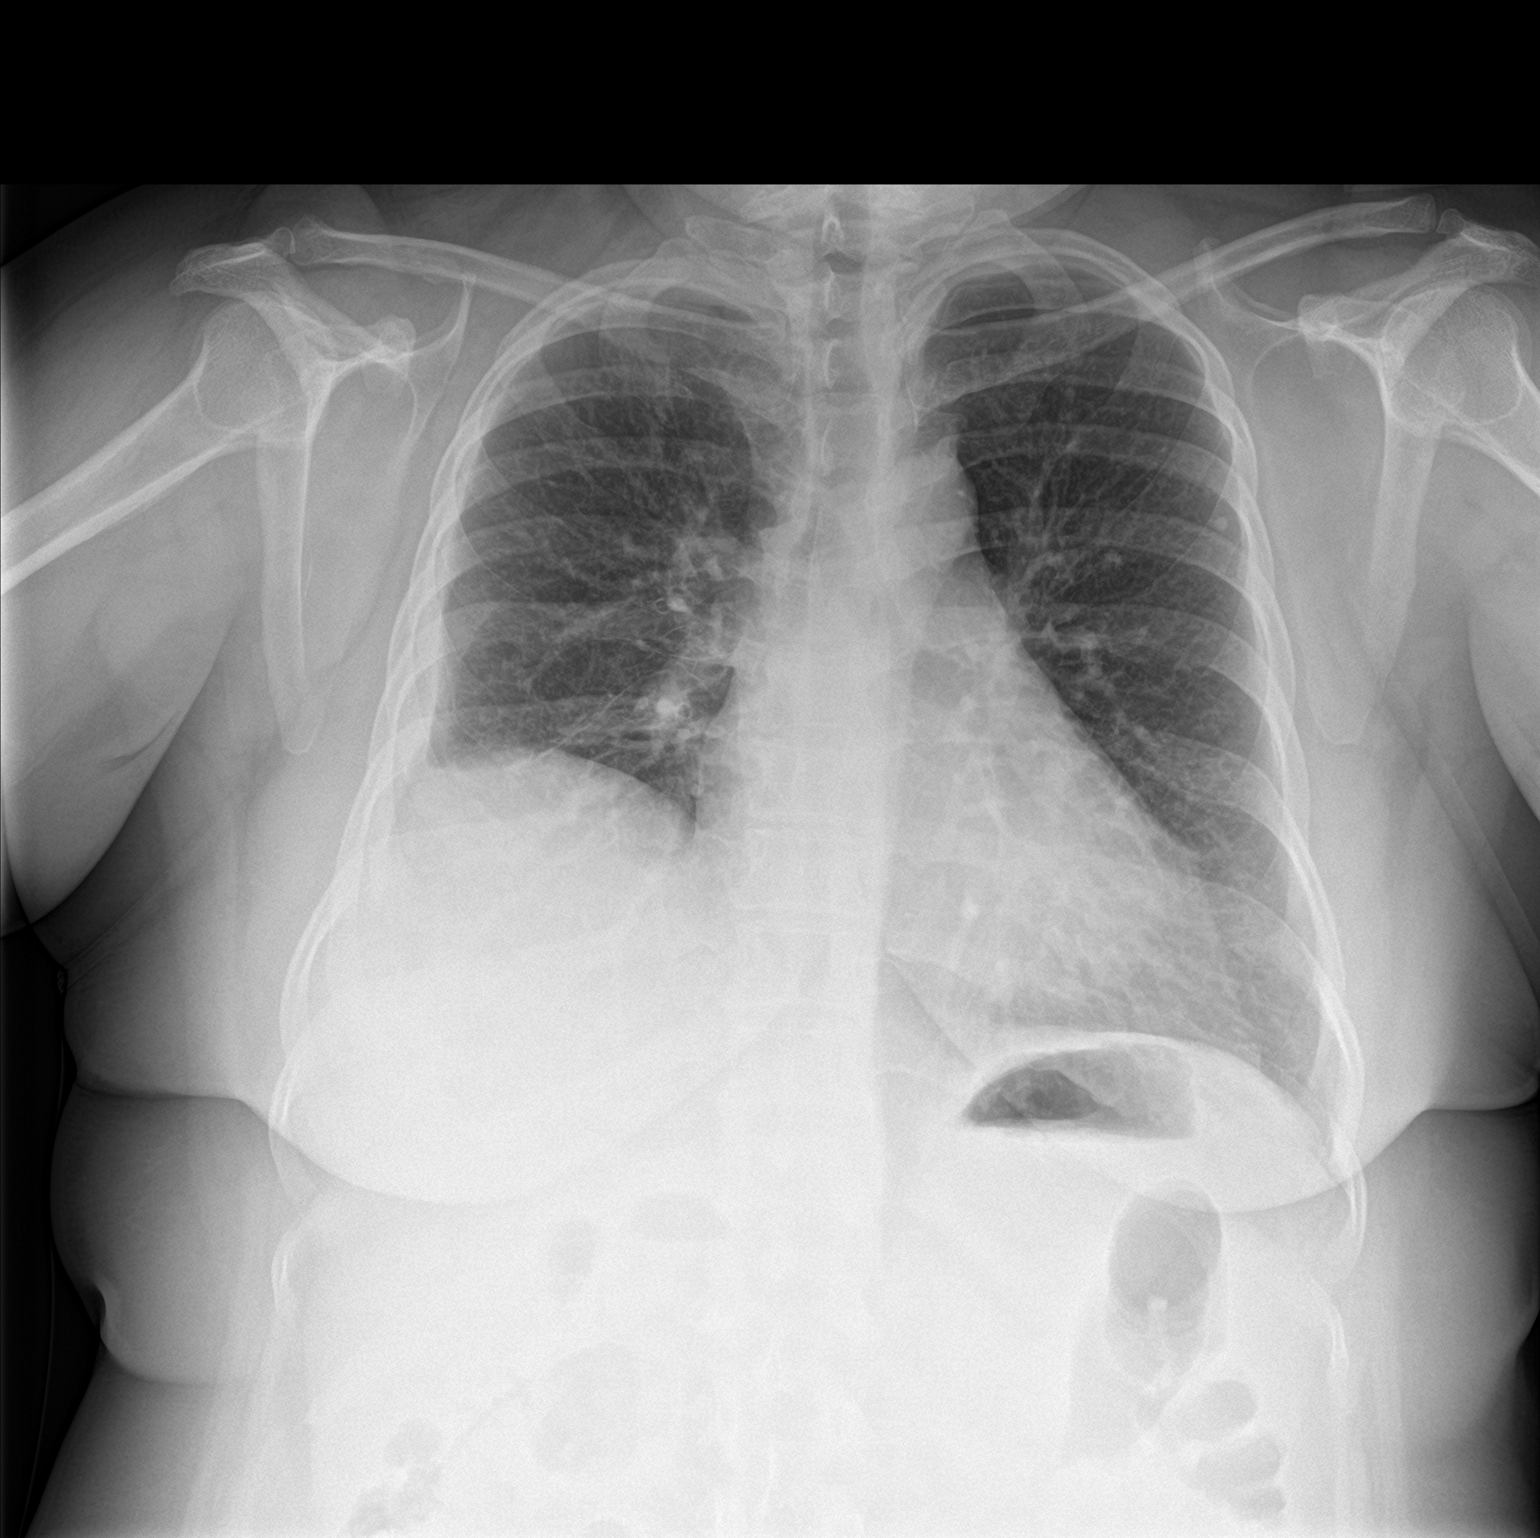

[chest lat]
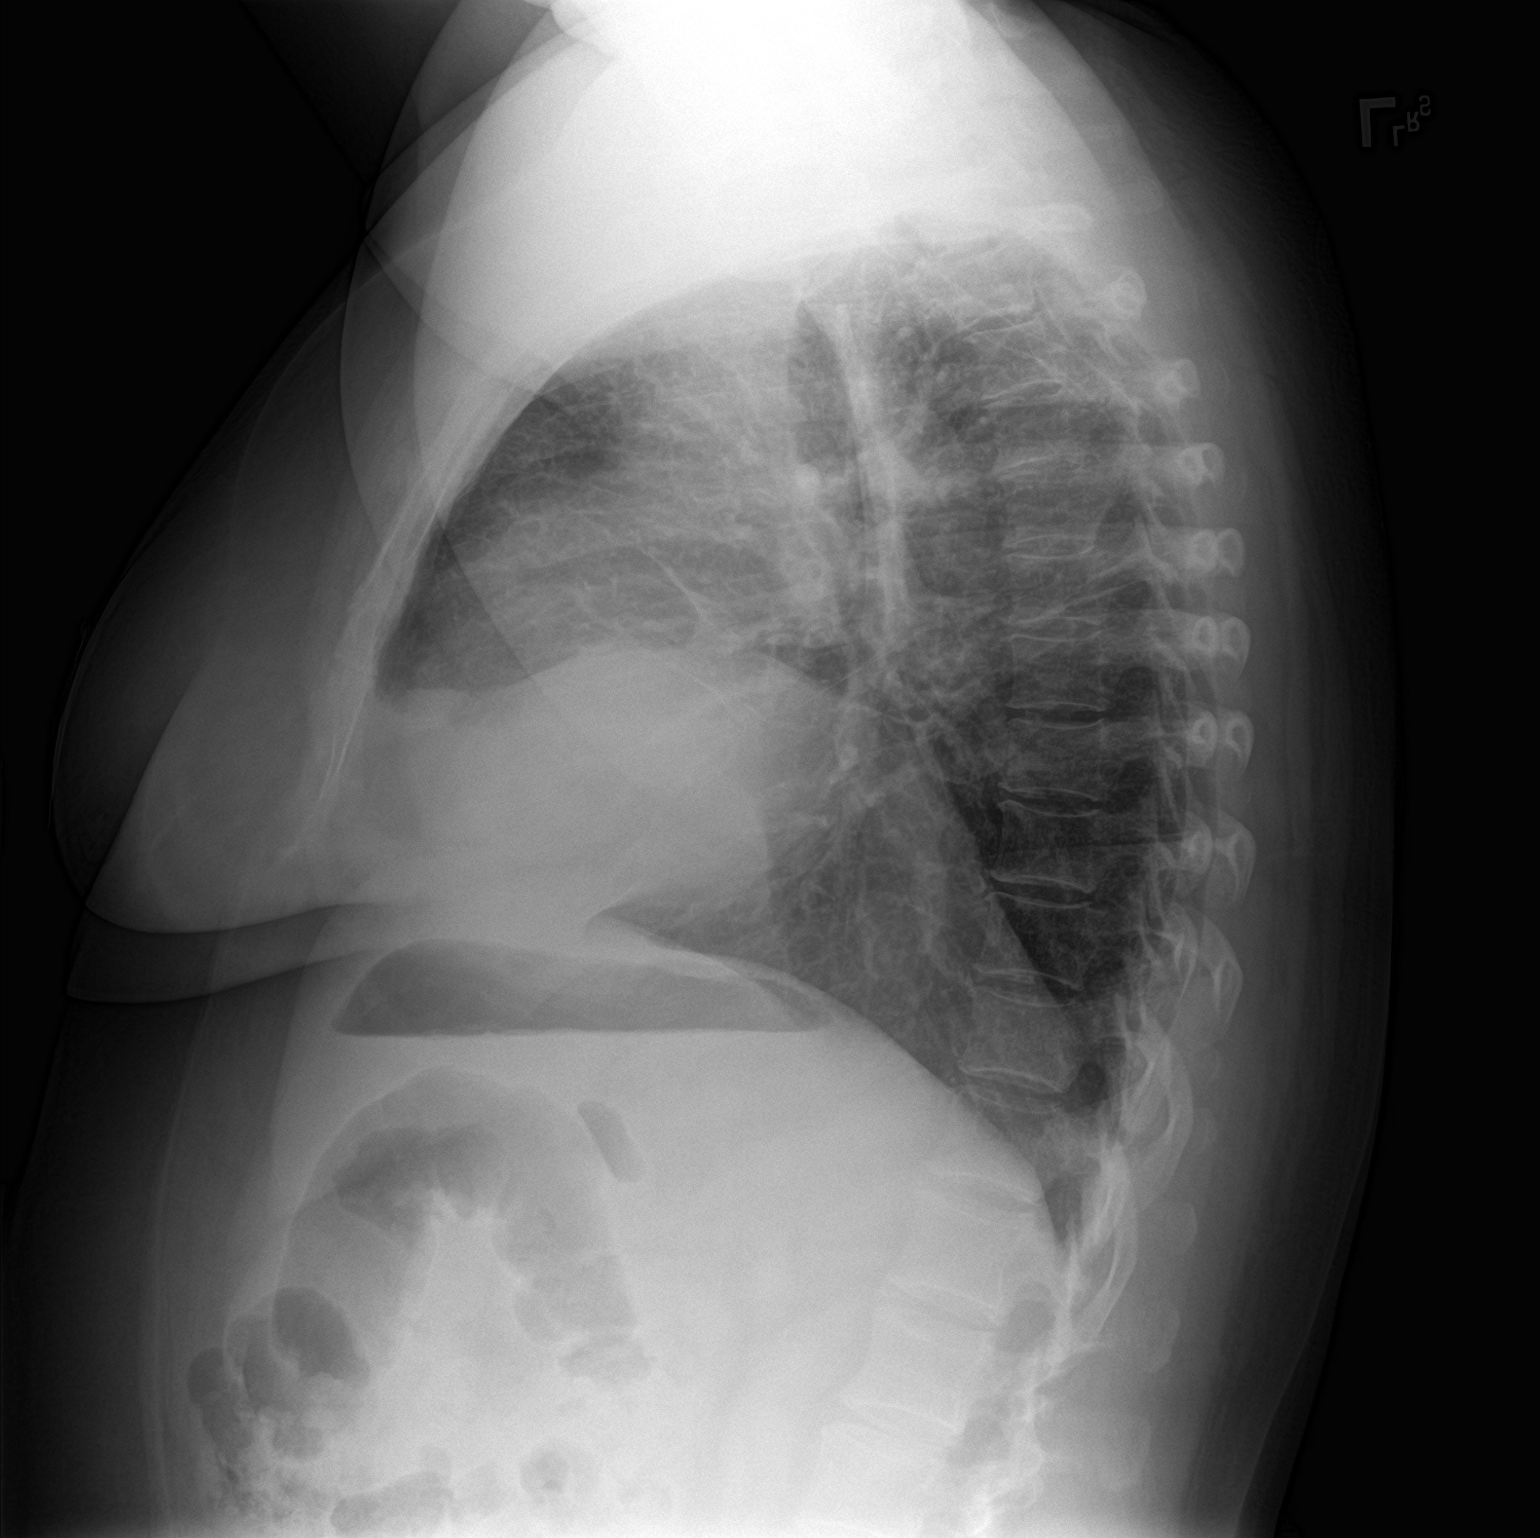

[2 of 2 positions shown; findings below may reference images not displayed]

FINDINGS: Calcified granuloma in the left upper lobe. Mild diffuse
interstitial prominence consistent bronchitic change. No alveolar
consolidation. Elevated right hemidiaphragm with mild extrapleural
thickening along the periphery of the right hemithorax. Loculated
small left effusion is not entirely excluded. Heart is top-normal in
size. There is aortic atherosclerosis. No acute osseous abnormality
IMPRESSION: 1. Mild diffuse interstitial prominence which may reflect bronchitic
change, possibly chronic. No alveolar consolidation.
2. Left upper lobe calcified nodule versus bone island.
3. Elevation of the right hemidiaphragm with mild extrapleural
thickening versus trace loculated fluid.
